# Patient Record
Sex: Female | Born: 1980 | Race: Black or African American | Hispanic: No | Marital: Married | Smoking: Never smoker
Health system: Southern US, Community
[De-identification: ages and names within clinical notes are randomized; demographics above are authoritative.]

## PROBLEM LIST (undated history)

## (undated) ENCOUNTER — Inpatient Hospital Stay (HOSPITAL_COMMUNITY): Payer: Self-pay

## (undated) DIAGNOSIS — Z789 Other specified health status: Secondary | ICD-10-CM

## (undated) HISTORY — PX: NO PAST SURGERIES: SHX2092

## (undated) HISTORY — DX: Other specified health status: Z78.9

---

## 2012-07-27 NOTE — L&D Delivery Note (Signed)
Delivery Note At 1:35 AM a viable female was delivered via Vaginal, Spontaneous Delivery (Presentation: ; Occiput Anterior).  APGAR: 9, 9; weight 7 lb 13.4 oz (3555 g).   Placenta status: Intact, Spontaneous.  Cord: 3 vessels with the following complications: Loose nuchal cord x 1 and body cord  Anesthesia: None  Episiotomy: None Lacerations: 2nd degree;Periurethral Suture Repair: 3.0 vicryl rapide Est. Blood Loss (mL): 350  Mom to postpartum.  Baby to nursery-stable.  Tereso Newcomer, MD 03/07/2013, 4:18 AM

## 2012-10-05 ENCOUNTER — Other Ambulatory Visit (HOSPITAL_COMMUNITY): Payer: Self-pay | Admitting: Nurse Practitioner

## 2012-10-05 LAB — OB RESULTS CONSOLE ANTIBODY SCREEN: Antibody Screen: NEGATIVE

## 2012-10-05 LAB — OB RESULTS CONSOLE HIV ANTIBODY (ROUTINE TESTING)
HIV: NONREACTIVE
HIV: NONREACTIVE

## 2012-10-05 LAB — OB RESULTS CONSOLE GC/CHLAMYDIA
Chlamydia: NEGATIVE
Gonorrhea: NEGATIVE

## 2012-10-05 LAB — OB RESULTS CONSOLE HEPATITIS B SURFACE ANTIGEN: Hepatitis B Surface Ag: NEGATIVE

## 2012-10-05 LAB — OB RESULTS CONSOLE RUBELLA ANTIBODY, IGM: Rubella: IMMUNE

## 2012-10-05 LAB — OB RESULTS CONSOLE RPR: RPR: NONREACTIVE

## 2012-10-07 ENCOUNTER — Ambulatory Visit (HOSPITAL_COMMUNITY)
Admission: RE | Admit: 2012-10-07 | Discharge: 2012-10-07 | Disposition: A | Discharge status: Home / Self Care | Payer: Self-pay | Source: Ambulatory Visit | Attending: Nurse Practitioner | Admitting: Nurse Practitioner

## 2012-10-07 ENCOUNTER — Encounter (HOSPITAL_COMMUNITY): Payer: Self-pay

## 2012-10-07 DIAGNOSIS — O358XX0 Maternal care for other (suspected) fetal abnormality and damage, not applicable or unspecified: Secondary | ICD-10-CM | POA: Insufficient documentation

## 2012-10-07 DIAGNOSIS — Z363 Encounter for antenatal screening for malformations: Secondary | ICD-10-CM | POA: Insufficient documentation

## 2013-01-30 LAB — OB RESULTS CONSOLE GC/CHLAMYDIA
Chlamydia: NEGATIVE
Gonorrhea: NEGATIVE

## 2013-02-28 ENCOUNTER — Other Ambulatory Visit (HOSPITAL_COMMUNITY): Payer: Self-pay | Admitting: Nurse Practitioner

## 2013-02-28 DIAGNOSIS — O480 Post-term pregnancy: Secondary | ICD-10-CM

## 2013-03-01 ENCOUNTER — Telehealth (HOSPITAL_COMMUNITY): Payer: Self-pay | Admitting: *Deleted

## 2013-03-01 ENCOUNTER — Encounter (HOSPITAL_COMMUNITY): Payer: Self-pay | Admitting: *Deleted

## 2013-03-01 NOTE — Telephone Encounter (Signed)
Preadmission screen  

## 2013-03-03 ENCOUNTER — Ambulatory Visit (HOSPITAL_COMMUNITY)
Admission: RE | Admit: 2013-03-03 | Discharge: 2013-03-03 | Disposition: A | Discharge status: Home / Self Care | Payer: Self-pay | Source: Ambulatory Visit | Attending: Nurse Practitioner | Admitting: Nurse Practitioner

## 2013-03-03 ENCOUNTER — Other Ambulatory Visit (HOSPITAL_COMMUNITY): Payer: Self-pay | Admitting: Nurse Practitioner

## 2013-03-03 DIAGNOSIS — O480 Post-term pregnancy: Secondary | ICD-10-CM | POA: Insufficient documentation

## 2013-03-03 DIAGNOSIS — Z3689 Encounter for other specified antenatal screening: Secondary | ICD-10-CM | POA: Insufficient documentation

## 2013-03-05 ENCOUNTER — Inpatient Hospital Stay (HOSPITAL_COMMUNITY)
Admission: AD | Admit: 2013-03-05 | Discharge: 2013-03-05 | Disposition: A | Discharge status: Home / Self Care | Payer: Medicaid Other | Source: Ambulatory Visit | Attending: Obstetrics and Gynecology | Admitting: Obstetrics and Gynecology

## 2013-03-05 ENCOUNTER — Encounter (HOSPITAL_COMMUNITY): Payer: Self-pay | Admitting: *Deleted

## 2013-03-05 DIAGNOSIS — O479 False labor, unspecified: Secondary | ICD-10-CM | POA: Insufficient documentation

## 2013-03-05 NOTE — MAU Note (Signed)
Pt presents with complaints of contractions that started last night and the contractions started getting closer together this morning. Denies any bleeding or LOF.

## 2013-03-07 ENCOUNTER — Inpatient Hospital Stay (HOSPITAL_COMMUNITY)
Admission: AD | Admit: 2013-03-07 | Discharge: 2013-03-09 | DRG: 775 | Disposition: A | Discharge status: Home / Self Care | Payer: Medicaid Other | Source: Ambulatory Visit | Attending: Obstetrics & Gynecology | Admitting: Obstetrics & Gynecology

## 2013-03-07 ENCOUNTER — Encounter (HOSPITAL_COMMUNITY): Payer: Self-pay | Admitting: *Deleted

## 2013-03-07 DIAGNOSIS — Z22330 Carrier of Group B streptococcus: Secondary | ICD-10-CM

## 2013-03-07 DIAGNOSIS — O9989 Other specified diseases and conditions complicating pregnancy, childbirth and the puerperium: Secondary | ICD-10-CM

## 2013-03-07 DIAGNOSIS — O99892 Other specified diseases and conditions complicating childbirth: Secondary | ICD-10-CM | POA: Diagnosis present

## 2013-03-07 LAB — CBC
HCT: 34.2 % — ABNORMAL LOW (ref 36.0–46.0)
Hemoglobin: 11.3 g/dL — ABNORMAL LOW (ref 12.0–15.0)
MCH: 29.6 pg (ref 26.0–34.0)
MCH: 29.7 pg (ref 26.0–34.0)
MCHC: 33 g/dL (ref 30.0–36.0)
MCV: 88.8 fL (ref 78.0–100.0)
MCV: 90 fL (ref 78.0–100.0)
Platelets: 154 10*3/uL (ref 150–400)
RDW: 13.2 % (ref 11.5–15.5)
RDW: 13.3 % (ref 11.5–15.5)
WBC: 5.6 10*3/uL (ref 4.0–10.5)

## 2013-03-07 LAB — RPR: RPR Ser Ql: NONREACTIVE

## 2013-03-07 MED ORDER — FLEET ENEMA 7-19 GM/118ML RE ENEM: 1.0000 | ENEMA | Freq: Every day | RECTAL | Status: DC | PRN

## 2013-03-07 MED ORDER — OXYCODONE-ACETAMINOPHEN 5-325 MG PO TABS
1.0000 | ORAL_TABLET | ORAL | Status: DC | PRN
  Administered 2013-03-07: 1 via ORAL
  Filled 2013-03-07: qty 1

## 2013-03-07 MED ORDER — MEASLES, MUMPS & RUBELLA VAC ~~LOC~~ INJ: 0.5000 mL | INJECTION | Freq: Once | SUBCUTANEOUS | Status: DC

## 2013-03-07 MED ORDER — ACETAMINOPHEN 325 MG PO TABS: 650.0000 mg | ORAL_TABLET | ORAL | Status: DC | PRN

## 2013-03-07 MED ORDER — LACTATED RINGERS IV SOLN
INTRAVENOUS | Status: DC
  Administered 2013-03-07: 01:00:00 via INTRAVENOUS

## 2013-03-07 MED ORDER — LANOLIN HYDROUS EX OINT: TOPICAL_OINTMENT | CUTANEOUS | Status: DC | PRN

## 2013-03-07 MED ORDER — BENZOCAINE-MENTHOL 20-0.5 % EX AERO
1.0000 "application " | INHALATION_SPRAY | CUTANEOUS | Status: DC | PRN
  Administered 2013-03-08: 1 via TOPICAL
  Filled 2013-03-07: qty 56

## 2013-03-07 MED ORDER — SIMETHICONE 80 MG PO CHEW: 80.0000 mg | CHEWABLE_TABLET | ORAL | Status: DC | PRN

## 2013-03-07 MED ORDER — ZOLPIDEM TARTRATE 5 MG PO TABS: 5.0000 mg | ORAL_TABLET | Freq: Every evening | ORAL | Status: DC | PRN

## 2013-03-07 MED ORDER — LIDOCAINE HCL (PF) 1 % IJ SOLN
30.0000 mL | INTRAMUSCULAR | Status: DC | PRN
  Filled 2013-03-07 (×2): qty 30

## 2013-03-07 MED ORDER — SENNOSIDES-DOCUSATE SODIUM 8.6-50 MG PO TABS
2.0000 | ORAL_TABLET | Freq: Every day | ORAL | Status: DC
  Administered 2013-03-07 – 2013-03-08 (×2): 2 via ORAL

## 2013-03-07 MED ORDER — TETANUS-DIPHTH-ACELL PERTUSSIS 5-2.5-18.5 LF-MCG/0.5 IM SUSP: 0.5000 mL | Freq: Once | INTRAMUSCULAR | Status: DC

## 2013-03-07 MED ORDER — WITCH HAZEL-GLYCERIN EX PADS: 1.0000 "application " | MEDICATED_PAD | CUTANEOUS | Status: DC | PRN

## 2013-03-07 MED ORDER — ONDANSETRON HCL 4 MG PO TABS: 4.0000 mg | ORAL_TABLET | ORAL | Status: DC | PRN

## 2013-03-07 MED ORDER — OXYTOCIN 40 UNITS IN LACTATED RINGERS INFUSION - SIMPLE MED
62.5000 mL/h | INTRAVENOUS | Status: DC
  Filled 2013-03-07: qty 1000

## 2013-03-07 MED ORDER — PRENATAL MULTIVITAMIN CH
1.0000 | ORAL_TABLET | Freq: Every day | ORAL | Status: DC
  Administered 2013-03-07 – 2013-03-09 (×3): 1 via ORAL
  Filled 2013-03-07 (×3): qty 1

## 2013-03-07 MED ORDER — DIPHENHYDRAMINE HCL 25 MG PO CAPS: 25.0000 mg | ORAL_CAPSULE | Freq: Four times a day (QID) | ORAL | Status: DC | PRN

## 2013-03-07 MED ORDER — ONDANSETRON HCL 4 MG/2ML IJ SOLN: 4.0000 mg | Freq: Four times a day (QID) | INTRAMUSCULAR | Status: DC | PRN

## 2013-03-07 MED ORDER — CITRIC ACID-SODIUM CITRATE 334-500 MG/5ML PO SOLN: 30.0000 mL | ORAL | Status: DC | PRN

## 2013-03-07 MED ORDER — BUTORPHANOL TARTRATE 1 MG/ML IJ SOLN: 1.0000 mg | INTRAMUSCULAR | Status: DC | PRN

## 2013-03-07 MED ORDER — IBUPROFEN 600 MG PO TABS
600.0000 mg | ORAL_TABLET | Freq: Four times a day (QID) | ORAL | Status: DC | PRN
  Administered 2013-03-07: 600 mg via ORAL
  Filled 2013-03-07: qty 1

## 2013-03-07 MED ORDER — LACTATED RINGERS IV SOLN: 500.0000 mL | INTRAVENOUS | Status: DC | PRN

## 2013-03-07 MED ORDER — ONDANSETRON HCL 4 MG/2ML IJ SOLN: 4.0000 mg | INTRAMUSCULAR | Status: DC | PRN

## 2013-03-07 MED ORDER — OXYTOCIN BOLUS FROM INFUSION
500.0000 mL | INTRAVENOUS | Status: DC
  Administered 2013-03-07: 500 mL via INTRAVENOUS

## 2013-03-07 MED ORDER — DIBUCAINE 1 % RE OINT: 1.0000 "application " | TOPICAL_OINTMENT | RECTAL | Status: DC | PRN

## 2013-03-07 MED ORDER — IBUPROFEN 600 MG PO TABS
600.0000 mg | ORAL_TABLET | Freq: Four times a day (QID) | ORAL | Status: DC
  Administered 2013-03-07 – 2013-03-09 (×9): 600 mg via ORAL
  Filled 2013-03-07 (×9): qty 1

## 2013-03-07 MED ORDER — SODIUM CHLORIDE 0.9 % IV SOLN
2.0000 g | Freq: Once | INTRAVENOUS | Status: AC
  Administered 2013-03-07: 2 g via INTRAVENOUS
  Filled 2013-03-07: qty 2000

## 2013-03-07 NOTE — H&P (Signed)
Stacie Evans is a 32 y.o.G2P1001 female presenting in active labor.  Followed at [facility], was 4 cm dilated during her visit today.  Reports frequent contractions, small amount of bleeding. No LOF. Good FM.  No complications during this pregnancy.  Had term SVD in 2012 of 7-5 infant.    History OB History   Grav Para Term Preterm Abortions TAB SAB Ect Mult Living   2 1 1       1      Past Medical History  Diagnosis Date  . Medical history non-contributory    Past Surgical History  Procedure Laterality Date  . No past surgeries     Family History: family history is negative for Alcohol abuse, and Arthritis, and Asthma, and Birth defects, and Cancer, and COPD, and Depression, and Diabetes, and Drug abuse, and Early death, and Hearing loss, and Heart disease, and Hyperlipidemia, and Hypertension, and Kidney disease, and Learning disabilities, and Mental illness, and Mental retardation, and Miscarriages / Stillbirths, and Stroke, and Vision loss, . Social History:  reports that she has never smoked. She has never used smokeless tobacco. She reports that she does not drink alcohol or use illicit drugs.   Prenatal Transfer Tool  Maternal Diabetes: No Genetic Screening: Normal Maternal Ultrasounds/Referrals: Normal Fetal Ultrasounds or other Referrals:  None Maternal Substance Abuse:  No Significant Maternal Medications:  None Significant Maternal Lab Results:  Lab values include: Group B Strep positive Other Comments:  None  Review of Systems  All other systems reviewed and are negative.      Last menstrual period 05/23/2012. Maternal Exam:  Uterine Assessment: Contraction strength is moderate.  Contraction duration is 40 seconds. Contraction frequency is regular.   Abdomen: Patient reports no abdominal tenderness. Fundal height is 40.   Fetal presentation: vertex  Introitus: Normal vulva. Normal vagina.  Ferning test: not done.  Nitrazine test: not done.  Pelvis: adequate for  delivery.   Cervix: Cervix evaluated by digital exam.   6/90/0/vertex  Fetal Exam Fetal Monitor Review: Baseline rate: 130.  Variability: moderate (6-25 bpm).   Pattern: accelerations present and early decelerations.    Fetal State Assessment: Category I - tracings are normal.     Physical Exam  Constitutional: She is oriented to person, place, and time. She appears well-developed and well-nourished.  HENT:  Head: Normocephalic and atraumatic.  Cardiovascular: Normal rate and regular rhythm.   Respiratory: Effort normal and breath sounds normal.  GI: Soft. Bowel sounds are normal.  Musculoskeletal: Normal range of motion.  Neurological: She is alert and oriented to person, place, and time.     Prenatal labs: ABO, Rh: A/Positive/-- (03/12 0000) Antibody: Negative (03/12 0000) Rubella: Immune (03/12 0000) RPR: Nonreactive (03/12 0000)  HBsAg: Negative (03/12 0000)  HIV: Non-reactive (03/12 0000)  GBS: Positive (07/07 0000)   Assessment/Plan: Admit to L&D Hopeful for SVD Reassuring fetal status  Tereso Newcomer, MD 03/07/2013, 1:00 AM

## 2013-03-07 NOTE — MAU Note (Signed)
To private auto to get pt per wheelchair, pt reports contractions. To room 7, Dr Macon Large in to check pt. SVE 6/90/0, report to [facility]. Pt to room 165 per stretcher

## 2013-03-07 NOTE — Progress Notes (Signed)
UR chart review completed.  

## 2013-03-07 NOTE — MAU Provider Note (Signed)
Please refer to H & P for admission details.  Gryffin Altice, MD, FACOG Attending Obstetrician & Gynecologist Faculty Practice, [facility]   

## 2013-03-08 ENCOUNTER — Encounter (HOSPITAL_COMMUNITY): Payer: Self-pay | Admitting: Physician Assistant

## 2013-03-08 NOTE — Progress Notes (Signed)
Post Partum Day 1 Subjective: no complaints, up ad lib, voiding, tolerating PO and + flatus Ms. Montufar is undecided on contraception.  Her husband is concerned about the side effects of hormonal contraception.  The Mirena and Paragard IUD were both discussed.     Objective: Blood pressure 100/53, pulse 67, temperature 97.4 F (36.3 C), temperature source Oral, resp. rate 18, height 5\' 9"  (1.753 m), weight 88.905 kg (196 lb), last menstrual period 05/23/2012, SpO2 98.00%, unknown if currently breastfeeding.  Physical Exam:  General: alert, cooperative and appears stated age Lochia: appropriate Uterine Fundus: firm DVT Evaluation: No evidence of DVT seen on physical exam. Negative Homan's sign. No cords or calf tenderness.   Recent Labs  03/07/13 0115 03/07/13 0610  HGB 13.2 11.3*  HCT 39.6 34.2*    Assessment/Plan: Plan for discharge tomorrow   LOS: 1 day   CLARK, MICHAEL L 03/08/2013, 9:14 AM   I have seen and examined this patient and agree with above documentation in the PA student's note. She is going to research the copper IUD after we discussed contraceptive options. D/c tomorrow.   Rulon Abide, M.D. [facility] Fellow 03/08/2013 11:43 AM

## 2013-03-08 NOTE — Lactation Note (Signed)
This note was copied from the chart of Stacie Geanie Schrag. Lactation Consultation Note  Patient Name: Stacie Evans ZOXWR'U Date: 03/08/2013 Reason for consult: Follow-up assessment   Maternal Data    Feeding   LATCH Score/Interventions Latch: Grasps breast easily, tongue down, lips flanged, rhythmical sucking.  Audible Swallowing: A few with stimulation  Type of Nipple: Everted at rest and after stimulation  Comfort (Breast/Nipple): Soft / non-tender     Hold (Positioning): Assistance needed to correctly position infant at breast and maintain latch. Intervention(s): Breastfeeding basics reviewed;Support Pillows;Position options  LATCH Score: 8  Lactation Tools Discussed/Used     Consult Status Consult Status: PRN  Experienced BF mom attempting to latch baby when I went into room. Mom reports that nipples are a little tender. Assisted with better positioning of baby and pillows and mom reports that feels much better. Reviewed wide open mouth and keeping the baby close to breast throughout the feeding. Mom reports that breasts are feeling a little fuller today. No questions at present. To call for assist prn.  Pamelia Hoit 03/08/2013, 3:43 PM

## 2013-03-09 ENCOUNTER — Inpatient Hospital Stay (HOSPITAL_COMMUNITY): Admission: RE | Admit: 2013-03-09 | Payer: Self-pay | Source: Ambulatory Visit

## 2013-03-09 MED ORDER — ACETAMINOPHEN-CODEINE 300-30 MG PO TABS: 1.0000 | ORAL_TABLET | ORAL | Status: DC | PRN

## 2013-03-09 MED ORDER — IBUPROFEN 600 MG PO TABS: 600.0000 mg | ORAL_TABLET | Freq: Four times a day (QID) | ORAL | Status: DC

## 2013-03-09 NOTE — Discharge Summary (Deleted)
Obstetric Discharge Summary Reason for Admission: onset of labor Prenatal Procedures: none Intrapartum Procedures: spontaneous vaginal delivery Postpartum Procedures: none Complications-Operative and Postpartum: none Hemoglobin  Date Value Range Status  03/07/2013 11.3* 12.0 - 15.0 g/dL Final     HCT  Date Value Range Status  03/07/2013 34.2* 36.0 - 46.0 % Final    Physical Exam:  General: alert, cooperative and no distress Lochia: appropriate Uterine Fundus: firm Incision: N/A DVT Evaluation: No evidence of DVT seen on physical exam. Negative Homan's sign. No cords or calf tenderness. No significant calf/ankle edema.  Discharge Diagnoses: Term Pregnancy-delivered  Discharge Information: Date: 03/09/2013 Activity: pelvic rest Diet: routine Medications: Ibuprofen, Percocet and Prenatal Multivitamin Condition: stable Instructions: refer to practice specific booklet Discharge to: home Breast feeding and wants to research Copper IUD for contraception.   Newborn Data: Live born female  Birth Weight: 7 lb 13.4 oz (3555 g) APGAR: 9, 9  Home with spouse.Mertie Moores, Tabita Corbo 03/09/2013, 7:24 AM

## 2013-03-09 NOTE — Discharge Summary (Signed)
  Reason for Admission: onset of labor  Prenatal Procedures: none  Intrapartum Procedures: spontaneous vaginal delivery  Postpartum Procedures: none  Complications-Operative and Postpartum: none  Hemoglobin   Date  Value  Range  Status   03/07/2013  11.3*  12.0 - 15.0 g/dL  Final      HCT   Date  Value  Range  Status   03/07/2013  34.2*  36.0 - 46.0 %  Final    Physical Exam:  Filed Vitals:   03/09/13 0605  BP: 94/57  Pulse: 63  Temp: 97.6 F (36.4 C)  Resp: 18    General: alert, cooperative and no distress  Lochia: appropriate  Uterine Fundus: firm  Incision: N/A  DVT Evaluation: No evidence of DVT seen on physical exam.  Negative Homan's sign.  No cords or calf tenderness.  No significant calf/ankle edema.  Discharge Diagnoses: Term Pregnancy-delivered  Discharge Information:  Date: 03/09/2013  Activity: pelvic rest  Diet: routine  Medications: Ibuprofen and Prenatal Multivitamin  Condition: stable  Instructions: refer to practice specific booklet  Discharge to: home  Breast feeding and wants to research Copper IUD for contraception.   Newborn Data:  Live born female  Birth Weight: 7 lb 13.4 oz (3555 g)  APGAR: 9, 9  I examined pt and agree with documentation above and PA-S plan of care. [facility]  Home with spouse.Mertie Moores, [place]  03/09/2013, 7:24 AM

## 2013-07-01 ENCOUNTER — Encounter (HOSPITAL_COMMUNITY): Payer: Self-pay | Admitting: Emergency Medicine

## 2013-07-01 ENCOUNTER — Emergency Department (HOSPITAL_COMMUNITY)
Admission: EM | Admit: 2013-07-01 | Discharge: 2013-07-01 | Disposition: A | Discharge status: Home / Self Care | Payer: Medicaid Other | Source: Home / Self Care | Attending: Emergency Medicine | Admitting: Emergency Medicine

## 2013-07-01 DIAGNOSIS — H5711 Ocular pain, right eye: Secondary | ICD-10-CM

## 2013-07-01 DIAGNOSIS — H119 Unspecified disorder of conjunctiva: Secondary | ICD-10-CM

## 2013-07-01 DIAGNOSIS — H5710 Ocular pain, unspecified eye: Secondary | ICD-10-CM

## 2013-07-01 MED ORDER — KETOROLAC TROMETHAMINE 0.5 % OP SOLN: 1.0000 [drp] | Freq: Four times a day (QID) | OPHTHALMIC | Status: DC

## 2013-07-01 NOTE — ED Notes (Signed)
Pt c/o right eye pain, redness onset 2 months Has tried OTC eye drops w/no relief Denies: f/vn/d, cold sxs She is alert w/no signs of acute distress.

## 2013-07-01 NOTE — ED Provider Notes (Signed)
Medical screening examination/treatment/procedure(s) were performed by non-physician practitioner and as supervising physician I was immediately available for consultation/collaboration.  Leslee Home, M.D.  Reuben Likes, MD 07/01/13 2053

## 2013-07-01 NOTE — ED Provider Notes (Signed)
CSN: [id number]     Arrival date & time 07/01/13  1241 History   First MD Initiated Contact with Patient 07/01/13 1430     Chief Complaint  Patient presents with  . Eye Pain   (Consider location/radiation/quality/duration/timing/severity/associated sxs/prior Treatment) HPI Comments: 32 year old otherwise healthy female presents complaining of right eye pain and redness for 2 months. For 2 months, she has intermittent pain in the eye that is always worse with blinking. She had a similar condition prior to her first pregnancy, but after she delivered, this went away. This current eye pain started during her most recent pregnancy and she did not worry about it the patient seemed it would go away, but this time it did not. She rates her pain as "not that bad." She has never seen a doctor about this problem. She has no blurry vision, or any pain within the eye itself, the pain is just on the surface of the eye in the area that is red. No problems with her other eye.  Patient is a 32 y.o. female presenting with eye pain.  Eye Pain Pertinent negatives include no chest pain, no abdominal pain and no shortness of breath.    Past Medical History  Diagnosis Date  . Medical history non-contributory    Past Surgical History  Procedure Laterality Date  . No past surgeries     Family History  Problem Relation Age of Onset  . Alcohol abuse Neg Hx   . Arthritis Neg Hx   . Asthma Neg Hx   . Birth defects Neg Hx   . Cancer Neg Hx   . COPD Neg Hx   . Depression Neg Hx   . Diabetes Neg Hx   . Drug abuse Neg Hx   . Early death Neg Hx   . Hearing loss Neg Hx   . Heart disease Neg Hx   . Hyperlipidemia Neg Hx   . Hypertension Neg Hx   . Kidney disease Neg Hx   . Learning disabilities Neg Hx   . Mental illness Neg Hx   . Mental retardation Neg Hx   . Miscarriages / Stillbirths Neg Hx   . Stroke Neg Hx   . Vision loss Neg Hx    History  Substance Use Topics  . Smoking status: Never Smoker   .  Smokeless tobacco: Never Used  . Alcohol Use: No   OB History   Grav Para Term Preterm Abortions TAB SAB Ect Mult Living   4 3 2       2      Review of Systems  Constitutional: Negative for fever and chills.  Eyes: Positive for pain and redness. Negative for visual disturbance.  Respiratory: Negative for cough and shortness of breath.   Cardiovascular: Negative for chest pain, palpitations and leg swelling.  Gastrointestinal: Negative for nausea, vomiting and abdominal pain.  Endocrine: Negative for polydipsia and polyuria.  Genitourinary: Negative for dysuria, urgency and frequency.  Musculoskeletal: Negative for arthralgias and myalgias.  Skin: Negative for rash.  Neurological: Negative for dizziness, weakness and light-headedness.    Allergies  Review of patient's allergies indicates no known allergies.  Home Medications   Current Outpatient Rx  Name  Route  Sig  Dispense  Refill  . Prenatal Vit-Fe Fumarate-FA (PRENATAL MULTIVITAMIN) TABS tablet   Oral   Take 1 tablet by mouth daily at 12 noon.         . Acetaminophen-Codeine (TYLENOL/CODEINE #3) 300-30 MG per tablet   Oral  Take 1 tablet by mouth every 4 (four) hours as needed for pain.   30 tablet   0   . ibuprofen (ADVIL,MOTRIN) 600 MG tablet   Oral   Take 1 tablet (600 mg total) by mouth every 6 (six) hours.   30 tablet   0   . ketorolac (ACULAR) 0.5 % ophthalmic solution   Right Eye   Place 1 drop into the right eye every 6 (six) hours.   3 mL   0    BP 125/85  Pulse 68  Temp(Src) 98.7 F (37.1 C) (Oral)  Resp 18 Physical Exam  Nursing note and vitals reviewed. Constitutional: She is oriented to person, place, and time. Vital signs are normal. She appears well-developed and well-nourished. No distress.  HENT:  Head: Normocephalic and atraumatic.  Eyes:    Pulmonary/Chest: Effort normal. No respiratory distress.  Neurological: She is alert and oriented to person, place, and time. She has  normal strength. Coordination normal.  Skin: Skin is warm and dry. No rash noted. She is not diaphoretic.  Psychiatric: She has a normal mood and affect. Judgment normal.    ED Course  Procedures (including critical care time) Labs Review Labs Reviewed - No data to display Imaging Review No results found.    MDM   1. Eye pain, right   2. Conjunctival lesion    This patient has a neovascular eyes/injected lesion on the conjunctiva. She has been referred to ophthalmology for further evaluation of her condition. Ketorolac eyedrops in the meantime to help with the pain.   Meds ordered this encounter  Medications  . ketorolac (ACULAR) 0.5 % ophthalmic solution    Sig: Place 1 drop into the right eye every 6 (six) hours.    Dispense:  3 mL    Refill:  0    Order Specific Question:  Supervising Provider    Answer:  Lorenz Coaster, DAVID C [[id number]]       Graylon Good, PA-C 07/01/13 1556

## 2014-04-04 IMAGING — US US OB DETAIL+14 WK
1 series · 12 of 28 positions shown · non-contrast
Comparison: none

[Series 1: us ob detail +14 wk · 12 of 57 slices shown]
[im 3/57]
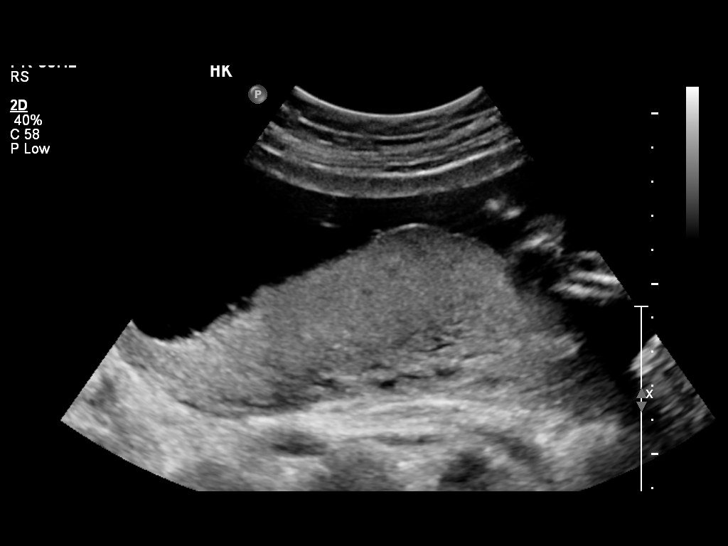
[im 7/57]
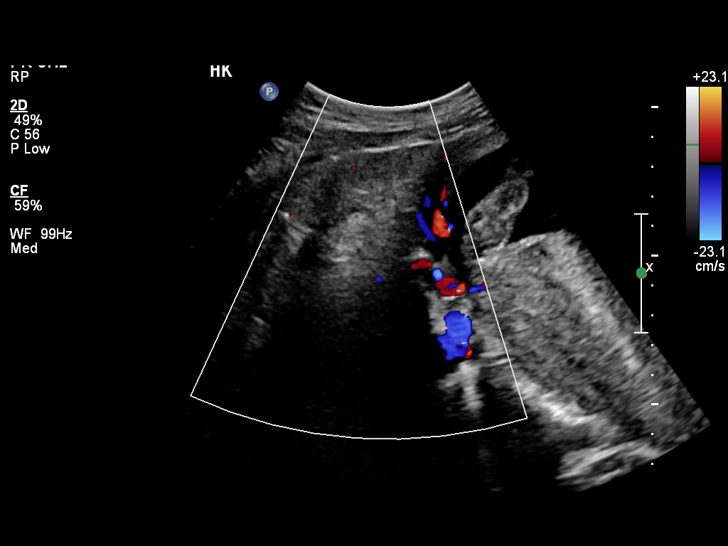
[im 11/57]
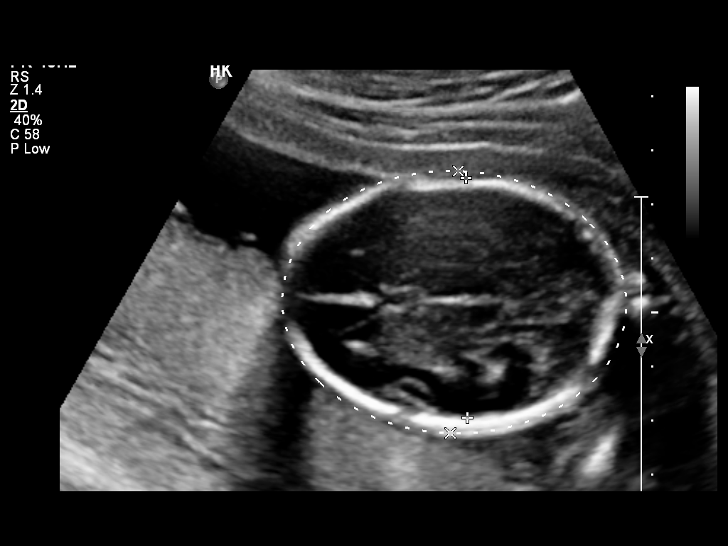
[im 17/57]
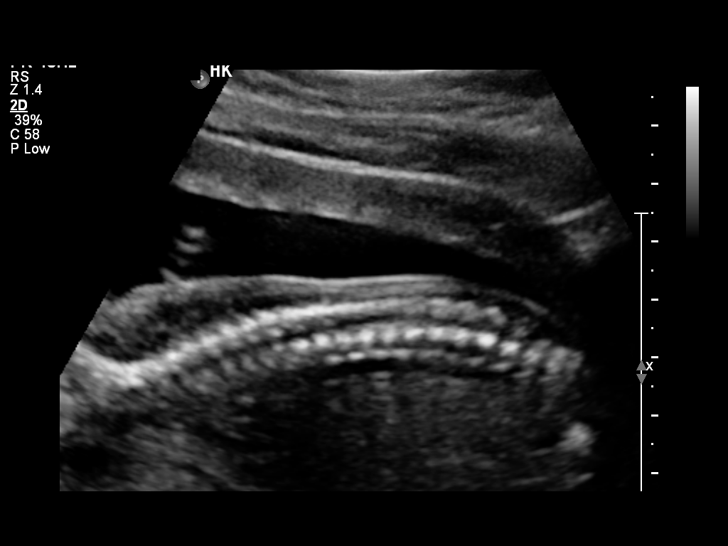
[im 21/57]
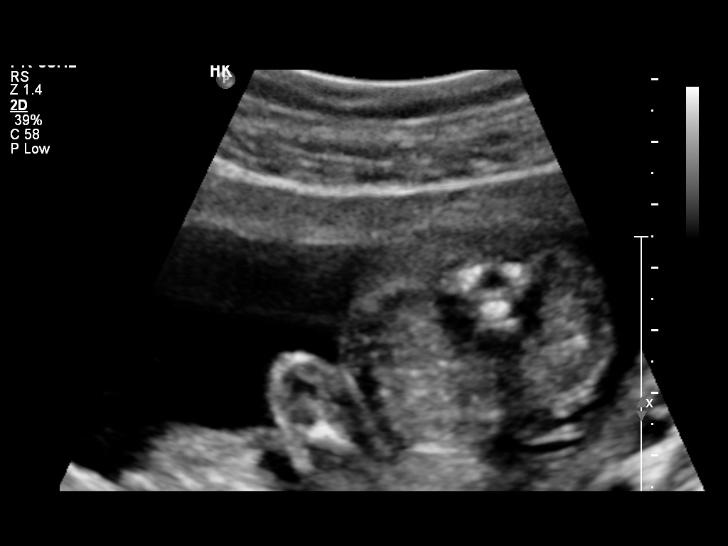
[im 25/57]
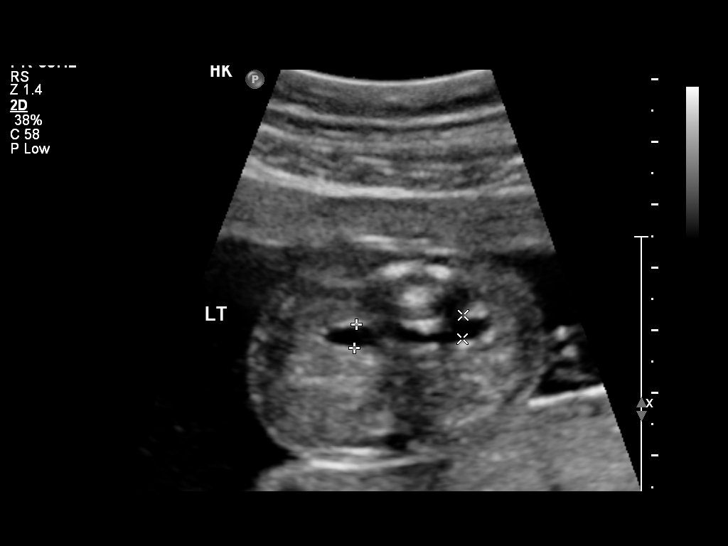
[im 32/57]
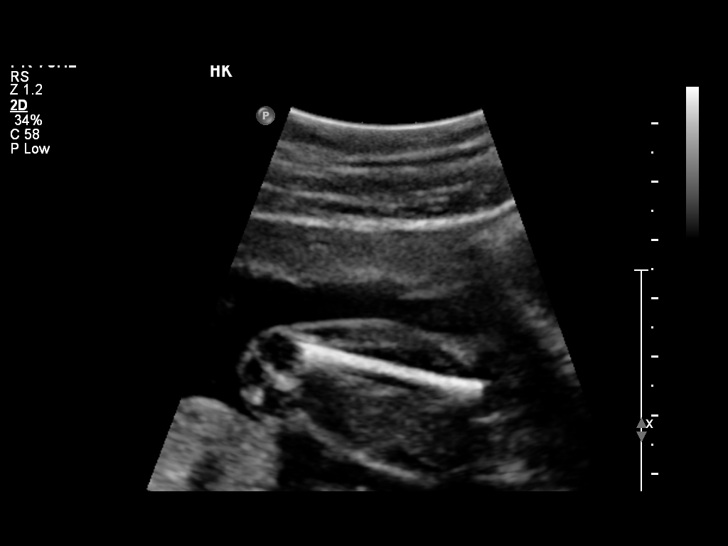
[im 36/57]
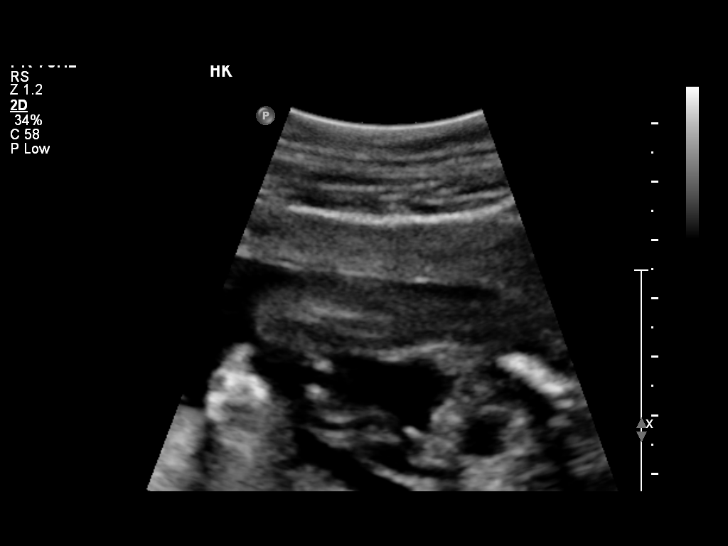
[im 40/57]
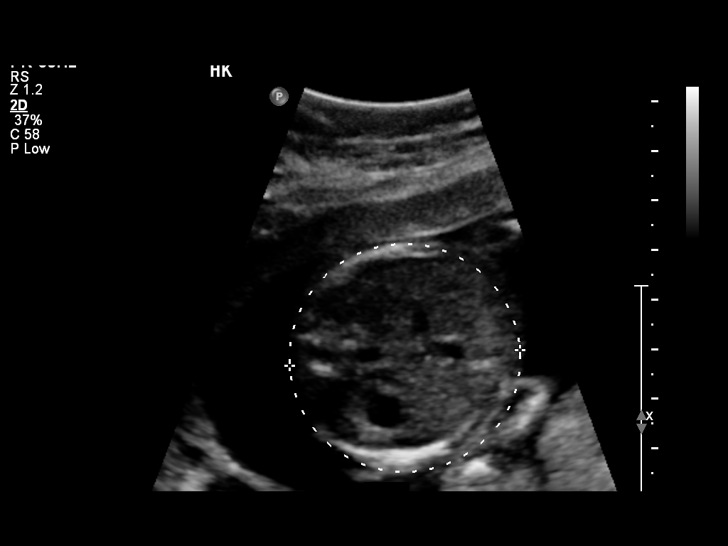
[im 46/57]
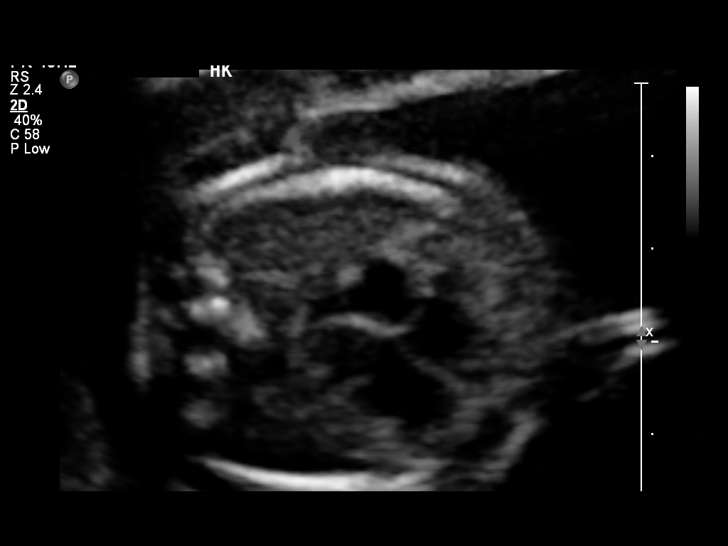
[im 50/57]
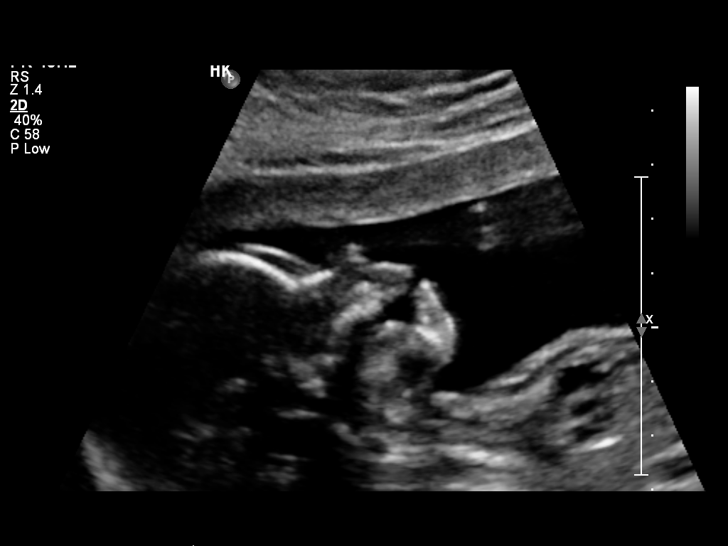
[im 54/57]
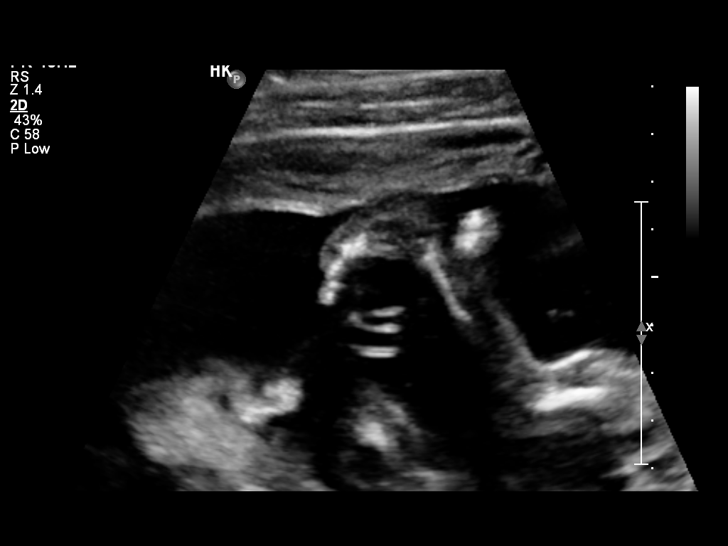

[12 of 28 positions shown; findings below may reference images not displayed]

OBSTETRICS REPORT
                      (Signed Final 10/07/2012 [DATE])

Service(s) Provided

 US OB DETAIL + 14 WK                                  76811.0
Indications

 Detailed fetal anatomic survey
Fetal Evaluation

 Num Of Fetuses:    1
 Preg. Location:    Intrauterine
 Fetal Heart Rate:  143                         bpm
 Cardiac Activity:  Observed
 Presentation:      Breech
 Placenta:          Posterior, above cervical
                    os
 P. Cord            Visualized, central
 Insertion:

 Amniotic Fluid
 AFI FV:      Subjectively within normal limits
                                             Larg Pckt:     5.3  cm
 LLQ:   5.3    cm
Biometry

 BPD:       44  mm    G. Age:   19w 2d                CI:        64.13   70 - 86
                                                      FL/HC:      17.4   16.8 -

 HC:     176.8  mm    G. Age:   20w 1d       70  %    HC/AC:      1.22   1.09 -

 AC:     144.5  mm    G. Age:   19w 5d       52  %    FL/BPD:
 FL:      30.7  mm    G. Age:   19w 4d       41  %    FL/AC:      21.2   20 - 24
 CER:     19.2  mm    G. Age:   18w 4d       25  %
 NFT:     4.09  mm

 Est. FW:     307  gm    0 lb 11 oz      50  %
Gestational Age

 LMP:           19w 4d       Date:   05/23/12                 EDD:   02/27/13
 U/S Today:     19w 5d                                        EDD:   02/26/13
 Best:          19w 4d    Det. By:   LMP  (05/23/12)          EDD:   02/27/13
Anatomy
 Cranium:          Appears normal         Aortic Arch:      Appears normal
 Fetal Cavum:      Appears normal         Ductal Arch:      Appears normal
 Ventricles:       Appears normal         Diaphragm:        Appears normal
 Choroid Plexus:   Appears normal         Stomach:          Appears normal
 Cerebellum:       Appears normal         Abdomen:          Appears normal
 Posterior Fossa:  Appears normal         Abdominal Wall:   Appears nml (cord
                                                            insert, abd wall)
 Nuchal Fold:      Appears normal         Cord Vessels:     Appears normal (3
                                                            vessel cord)
 Face:             Appears normal         Kidneys:          Appear normal
                   (orbits and profile)
 Lips:             Appears normal         Bladder:          Appears normal
 Heart:            Appears normal         Spine:            Appears normal
                   (4CH, axis, and
                   situs)
 RVOT:             Appears normal         Lower             Appears normal
                                          Extremities:
 LVOT:             Appears normal         Upper             Appears normal
                                          Extremities:

 Other:  Heels and 5th digit previously seen. Nasal bone visualized. Fetus
         appears to be a male.
Targeted Anatomy

 Fetal Central Nervous System
 Lat. Ventricles:  7.8                    Cisterna Magna:
Cervix Uterus Adnexa

 Cervical Length:   3.22      cm

 Cervix:       Normal appearance by transabdominal scan.
 Uterus:       No abnormality visualized.
 Cul De Sac:   No free fluid seen.

 Adnexa:     No abnormality visualized.
Impression

 Single live IUP in breech presentation.   Concordant
 measurements/assigned GA by LMP.
 No anatomic abnormality seen with a good quality survey
 possible.

 questions or concerns.

## 2014-05-28 ENCOUNTER — Encounter (HOSPITAL_COMMUNITY): Payer: Self-pay | Admitting: Emergency Medicine

## 2015-07-28 NOTE — L&D Delivery Note (Signed)
Delivery Note At 9:28 AM a viable female was delivered via  (Presentation:vertex ; LOA ).  APGAR:,; weight  pending.   Placenta status: spontaneous,duncan .  Cord:  3VC with the following complications: none.  Cord pH: n/a  Anesthesia:  IV meds Episiotomy:  none Lacerations: periurethral lac- no repair; 2 degree perineal lac  Suture Repair: 3.0 vicryl Est. Blood Loss (mL):  150  Mom to postpartum.  Baby to Couplet care / Skin to Skin.  Stacie Evans Cho 06/14/2016, 9:55 AM

## 2016-01-27 LAB — OB RESULTS CONSOLE GC/CHLAMYDIA
Chlamydia: NEGATIVE
Gonorrhea: NEGATIVE

## 2016-01-27 LAB — OB RESULTS CONSOLE ANTIBODY SCREEN: Antibody Screen: NEGATIVE

## 2016-01-27 LAB — OB RESULTS CONSOLE HIV ANTIBODY (ROUTINE TESTING): HIV: NONREACTIVE

## 2016-01-27 LAB — OB RESULTS CONSOLE ABO/RH: RH Type: POSITIVE

## 2016-01-27 LAB — OB RESULTS CONSOLE RPR: RPR: NONREACTIVE

## 2016-01-27 LAB — OB RESULTS CONSOLE HEPATITIS B SURFACE ANTIGEN: HEP B S AG: NEGATIVE

## 2016-01-27 LAB — OB RESULTS CONSOLE RUBELLA ANTIBODY, IGM: RUBELLA: IMMUNE

## 2016-04-30 LAB — OB RESULTS CONSOLE GC/CHLAMYDIA
CHLAMYDIA, DNA PROBE: NEGATIVE
GC PROBE AMP, GENITAL: NEGATIVE

## 2016-04-30 LAB — OB RESULTS CONSOLE GBS: GBS: POSITIVE

## 2016-06-04 ENCOUNTER — Encounter (HOSPITAL_COMMUNITY): Payer: Self-pay | Admitting: *Deleted

## 2016-06-04 ENCOUNTER — Telehealth (HOSPITAL_COMMUNITY): Payer: Self-pay | Admitting: *Deleted

## 2016-06-04 NOTE — Telephone Encounter (Signed)
Preadmission screen  

## 2016-06-11 ENCOUNTER — Inpatient Hospital Stay (HOSPITAL_COMMUNITY): Admission: RE | Admit: 2016-06-11 | Payer: Medicaid Other | Source: Ambulatory Visit

## 2016-06-13 ENCOUNTER — Inpatient Hospital Stay (HOSPITAL_COMMUNITY)
Admission: AD | Admit: 2016-06-13 | Discharge: 2016-06-14 | Discharge status: Absent without leave | Payer: Medicaid Other | Source: Home / Self Care

## 2016-06-14 ENCOUNTER — Encounter (HOSPITAL_COMMUNITY): Payer: Self-pay

## 2016-06-14 ENCOUNTER — Inpatient Hospital Stay (HOSPITAL_COMMUNITY)
Admission: RE | Admit: 2016-06-14 | Discharge: 2016-06-16 | DRG: 775 | Disposition: A | Discharge status: Home / Self Care | Payer: Medicaid Other | Source: Ambulatory Visit | Attending: Obstetrics & Gynecology | Admitting: Obstetrics & Gynecology

## 2016-06-14 VITALS — BP 110/64 | HR 65 | Temp 98.2°F | Resp 18 | Ht 68.0 in | Wt 211.0 lb

## 2016-06-14 DIAGNOSIS — O480 Post-term pregnancy: Principal | ICD-10-CM | POA: Diagnosis present

## 2016-06-14 DIAGNOSIS — O99824 Streptococcus B carrier state complicating childbirth: Secondary | ICD-10-CM | POA: Diagnosis present

## 2016-06-14 DIAGNOSIS — Z3A41 41 weeks gestation of pregnancy: Secondary | ICD-10-CM

## 2016-06-14 LAB — CBC
HCT: 39.5 % (ref 36.0–46.0)
Hemoglobin: 13.1 g/dL (ref 12.0–15.0)
MCH: 30.4 pg (ref 26.0–34.0)
MCHC: 33.2 g/dL (ref 30.0–36.0)
MCV: 91.6 fL (ref 78.0–100.0)
Platelets: 141 10*3/uL — ABNORMAL LOW (ref 150–400)
RBC: 4.31 MIL/uL (ref 3.87–5.11)
RDW: 13.6 % (ref 11.5–15.5)
WBC: 4.5 10*3/uL (ref 4.0–10.5)

## 2016-06-14 LAB — TYPE AND SCREEN
ABO/RH(D): A POS
Antibody Screen: NEGATIVE

## 2016-06-14 LAB — RPR: RPR Ser Ql: NONREACTIVE

## 2016-06-14 LAB — ABO/RH: ABO/RH(D): A POS

## 2016-06-14 MED ORDER — DIBUCAINE 1 % RE OINT
1.0000 | TOPICAL_OINTMENT | RECTAL | Status: DC | PRN
Start: 2016-06-14 — End: 2016-06-16

## 2016-06-14 MED ORDER — ONDANSETRON HCL 4 MG/2ML IJ SOLN: 4.0000 mg | INTRAMUSCULAR | Status: DC | PRN

## 2016-06-14 MED ORDER — PRENATAL MULTIVITAMIN CH
1.0000 | ORAL_TABLET | Freq: Every day | ORAL | Status: DC
  Administered 2016-06-14 – 2016-06-15 (×2): 1 via ORAL
  Filled 2016-06-14 (×2): qty 1

## 2016-06-14 MED ORDER — BENZOCAINE-MENTHOL 20-0.5 % EX AERO
1.0000 "application " | INHALATION_SPRAY | CUTANEOUS | Status: DC | PRN
  Filled 2016-06-14: qty 56

## 2016-06-14 MED ORDER — DEXTROSE 5 % IV SOLN
5.0000 10*6.[IU] | Freq: Once | INTRAVENOUS | Status: AC
  Administered 2016-06-14: 5 10*6.[IU] via INTRAVENOUS
  Filled 2016-06-14: qty 5

## 2016-06-14 MED ORDER — SENNOSIDES-DOCUSATE SODIUM 8.6-50 MG PO TABS
2.0000 | ORAL_TABLET | ORAL | Status: DC
  Administered 2016-06-14 – 2016-06-15 (×2): 2 via ORAL
  Filled 2016-06-14 (×2): qty 2

## 2016-06-14 MED ORDER — ONDANSETRON HCL 4 MG/2ML IJ SOLN: 4.0000 mg | Freq: Four times a day (QID) | INTRAMUSCULAR | Status: DC | PRN

## 2016-06-14 MED ORDER — FENTANYL CITRATE (PF) 100 MCG/2ML IJ SOLN
100.0000 ug | INTRAMUSCULAR | Status: DC | PRN
  Administered 2016-06-14 (×2): 100 ug via INTRAVENOUS
  Filled 2016-06-14 (×2): qty 2

## 2016-06-14 MED ORDER — ACETAMINOPHEN 325 MG PO TABS: 650.0000 mg | ORAL_TABLET | ORAL | Status: DC | PRN

## 2016-06-14 MED ORDER — ONDANSETRON HCL 4 MG PO TABS: 4.0000 mg | ORAL_TABLET | ORAL | Status: DC | PRN

## 2016-06-14 MED ORDER — MEASLES, MUMPS & RUBELLA VAC ~~LOC~~ INJ: 0.5000 mL | INJECTION | Freq: Once | SUBCUTANEOUS | Status: DC

## 2016-06-14 MED ORDER — OXYCODONE-ACETAMINOPHEN 5-325 MG PO TABS: 2.0000 | ORAL_TABLET | ORAL | Status: DC | PRN

## 2016-06-14 MED ORDER — SODIUM CHLORIDE 0.9% FLUSH: 3.0000 mL | INTRAVENOUS | Status: DC | PRN

## 2016-06-14 MED ORDER — OXYCODONE-ACETAMINOPHEN 5-325 MG PO TABS: 1.0000 | ORAL_TABLET | ORAL | Status: DC | PRN

## 2016-06-14 MED ORDER — TERBUTALINE SULFATE 1 MG/ML IJ SOLN
0.2500 mg | Freq: Once | INTRAMUSCULAR | Status: DC | PRN
  Filled 2016-06-14: qty 1

## 2016-06-14 MED ORDER — TETANUS-DIPHTH-ACELL PERTUSSIS 5-2.5-18.5 LF-MCG/0.5 IM SUSP: 0.5000 mL | Freq: Once | INTRAMUSCULAR | Status: DC

## 2016-06-14 MED ORDER — FLEET ENEMA 7-19 GM/118ML RE ENEM: 1.0000 | ENEMA | RECTAL | Status: DC | PRN

## 2016-06-14 MED ORDER — IBUPROFEN 600 MG PO TABS
600.0000 mg | ORAL_TABLET | Freq: Four times a day (QID) | ORAL | Status: DC
  Administered 2016-06-14 – 2016-06-16 (×8): 600 mg via ORAL
  Filled 2016-06-14 (×8): qty 1

## 2016-06-14 MED ORDER — ZOLPIDEM TARTRATE 5 MG PO TABS: 5.0000 mg | ORAL_TABLET | Freq: Every evening | ORAL | Status: DC | PRN

## 2016-06-14 MED ORDER — WITCH HAZEL-GLYCERIN EX PADS: 1.0000 "application " | MEDICATED_PAD | CUTANEOUS | Status: DC | PRN

## 2016-06-14 MED ORDER — OXYTOCIN BOLUS FROM INFUSION
500.0000 mL | Freq: Once | INTRAVENOUS | Status: AC
  Administered 2016-06-14: 500 mL via INTRAVENOUS

## 2016-06-14 MED ORDER — PENICILLIN G POT IN DEXTROSE 60000 UNIT/ML IV SOLN
3.0000 10*6.[IU] | INTRAVENOUS | Status: DC
  Administered 2016-06-14: 3 10*6.[IU] via INTRAVENOUS
  Filled 2016-06-14 (×3): qty 50

## 2016-06-14 MED ORDER — SODIUM CHLORIDE 0.9 % IV SOLN: 250.0000 mL | INTRAVENOUS | Status: DC | PRN

## 2016-06-14 MED ORDER — LIDOCAINE HCL (PF) 1 % IJ SOLN
30.0000 mL | INTRAMUSCULAR | Status: DC | PRN
  Administered 2016-06-14: 30 mL via SUBCUTANEOUS
  Filled 2016-06-14: qty 30

## 2016-06-14 MED ORDER — OXYTOCIN 40 UNITS IN LACTATED RINGERS INFUSION - SIMPLE MED
2.5000 [IU]/h | INTRAVENOUS | Status: DC
  Filled 2016-06-14: qty 1000

## 2016-06-14 MED ORDER — SODIUM CHLORIDE 0.9% FLUSH: 3.0000 mL | Freq: Two times a day (BID) | INTRAVENOUS | Status: DC

## 2016-06-14 MED ORDER — DIPHENHYDRAMINE HCL 25 MG PO CAPS: 25.0000 mg | ORAL_CAPSULE | Freq: Four times a day (QID) | ORAL | Status: DC | PRN

## 2016-06-14 MED ORDER — SIMETHICONE 80 MG PO CHEW: 80.0000 mg | CHEWABLE_TABLET | ORAL | Status: DC | PRN

## 2016-06-14 MED ORDER — LACTATED RINGERS IV SOLN
INTRAVENOUS | Status: DC
  Administered 2016-06-14: 01:00:00 via INTRAVENOUS

## 2016-06-14 MED ORDER — SOD CITRATE-CITRIC ACID 500-334 MG/5ML PO SOLN: 30.0000 mL | ORAL | Status: DC | PRN

## 2016-06-14 MED ORDER — OXYTOCIN 40 UNITS IN LACTATED RINGERS INFUSION - SIMPLE MED
1.0000 m[IU]/min | INTRAVENOUS | Status: DC
  Administered 2016-06-14: 2 m[IU]/min via INTRAVENOUS
  Administered 2016-06-14: 16 m[IU]/min via INTRAVENOUS

## 2016-06-14 MED ORDER — LACTATED RINGERS IV SOLN: 500.0000 mL | INTRAVENOUS | Status: DC | PRN

## 2016-06-14 MED ORDER — COCONUT OIL OIL
1.0000 "application " | TOPICAL_OIL | Status: DC | PRN
  Filled 2016-06-14: qty 120

## 2016-06-14 NOTE — Anesthesia Pain Management Evaluation Note (Signed)
  CRNA Pain Management Visit Note  Patient: Stacie Evans, 35 y.o., female  "Hello [facility]. We have an anesthesia team available at all times to provide care throughout the hospital, including epidural management and anesthesia for C-section. I don't know your plan for the delivery whether it a natural birth, water birth, IV sedation, nitrous supplementation, doula or epidural, but we want to meet your pain goals."   1.Was your pain managed to your expectations on prior hospitalizations?   Yes   2.What is your expectation for pain management during this hospitalization?     IV pain meds  3.How can we help you reach that goal? Support prn  Record the patient's initial score and the patient's pain goal.   Pain: 10  Pain Goal: 7 [facility] wants you to be able to say your pain was always managed very well.  [facility],Roshana Shuffield 06/14/2016

## 2016-06-14 NOTE — Lactation Note (Signed)
This note was copied from a baby's chart. Lactation Consultation Note  Patient Name: Stacie Evans ZOXWR'UToday's Date: 06/14/2016 Reason for consult: Initial assessment Baby at 10 hr of life. Experienced bf mom reporting bilateral nipple soreness. No skin break down was noted but mom has very dry flay skin on the entire breat. Given coconut oil. Soreness is most likely positioning. Baby was laying on his back on pillows in front of mom. She was leaning over him, dangling her nipple in his mouth, and letting him latch to the tip of the nipple. Showed mom how to unwrap baby, hold his body (not hold the pillow under him), turn him to her, when he opens wide pull in close to her, and compress the breast to get a deep latch. Discussed baby behavior, feeding frequency, baby belly size, voids, wt loss, breast changes, and nipple care. Demonstrated manual expression, colostrum noted bilaterally. Given lactation handouts. Aware of OP services and support group.    Maternal Data Has patient been taught Hand Expression?: Yes Does the patient have breastfeeding experience prior to this delivery?: Yes  Feeding Feeding Type: Breast Fed Length of feed: 5 min  LATCH Score/Interventions Latch: Grasps breast easily, tongue down, lips flanged, rhythmical sucking. Intervention(s): Adjust position;Breast compression  Audible Swallowing: None Intervention(s): Hand expression;Skin to skin  Type of Nipple: Everted at rest and after stimulation  Comfort (Breast/Nipple): Filling, red/small blisters or bruises, mild/mod discomfort  Problem noted: Mild/Moderate discomfort Interventions (Mild/moderate discomfort): Hand expression (coconut oil)  Hold (Positioning): Assistance needed to correctly position infant at breast and maintain latch. Intervention(s): Support Pillows;Position options  LATCH Score: 6  Lactation Tools Discussed/Used     Consult Status Consult Status: Follow-up Date:  06/15/16 Follow-up type: In-patient    [name] 06/14/2016, 8:09 PM

## 2016-06-14 NOTE — H&P (Signed)
LABOR ADMISSION HISTORY AND PHYSICAL  Stacie Evans is a 35 y.o. female 965P2002 with IUP at 1590w3d by 21w U/S presenting for PD IOL. She reports +FM, + contractions, No LOF, no VB, no blurry vision, headaches or peripheral edema, and RUQ pain.  She plans on breast and bottle feeding. She plans on using condoms for birth control.  Dating: By Larene Beach21w U/S --->  Estimated Date of Delivery: 06/04/16  Sono:    @ 3145w4d, 6 day discrepancy from LMP, normal anatomy, cephalic presentation, transverse lie, 477.99 g, 28.7% EFW   Prenatal History/Complications: GBS pos FOB may have hepatitis per HD records MOB with negative HbSag  Past Medical History: Past Medical History:  Diagnosis Date  . Medical history non-contributory     Past Surgical History: Past Surgical History:  Procedure Laterality Date  . NO PAST SURGERIES      Obstetrical History: OB History    Gravida Para Term Preterm AB Living   5 3 2     2    SAB TAB Ectopic Multiple Live Births           2      Social History: Social History   Social History  . Marital status: Married    Spouse name: N/A  . Number of children: N/A  . Years of education: N/A   Social History Main Topics  . Smoking status: Never Smoker  . Smokeless tobacco: Never Used  . Alcohol use No  . Drug use: No  . Sexual activity: Yes   Other Topics Concern  . None   Social History Narrative  . None    Family History: Family History  Problem Relation Age of Onset  . Alcohol abuse Neg Hx   . Arthritis Neg Hx   . Asthma Neg Hx   . Birth defects Neg Hx   . Cancer Neg Hx   . COPD Neg Hx   . Depression Neg Hx   . Diabetes Neg Hx   . Drug abuse Neg Hx   . Early death Neg Hx   . Hearing loss Neg Hx   . Heart disease Neg Hx   . Hyperlipidemia Neg Hx   . Hypertension Neg Hx   . Kidney disease Neg Hx   . Learning disabilities Neg Hx   . Mental illness Neg Hx   . Mental retardation Neg Hx   . Miscarriages / Stillbirths Neg Hx   . Stroke Neg  Hx   . Vision loss Neg Hx     Allergies: No Known Allergies  Prescriptions Prior to Admission  Medication Sig Dispense Refill Last Dose  . Acetaminophen-Codeine (TYLENOL/CODEINE #3) 300-30 MG per tablet Take 1 tablet by mouth every 4 (four) hours as needed for pain. 30 tablet 0 06/14/2016  . ibuprofen (ADVIL,MOTRIN) 600 MG tablet Take 1 tablet (600 mg total) by mouth every 6 (six) hours. 30 tablet 0 06/14/2016  . Prenatal Vit-Fe Fumarate-FA (PRENATAL MULTIVITAMIN) TABS tablet Take 1 tablet by mouth daily at 12 noon.   06/14/2016     Review of Systems   All systems reviewed and negative except as stated in HPI  Temp 97.6 F (36.4 C) (Oral)   Resp 16   Ht 5\' 8"  (1.727 m)   Wt 95.7 kg (211 lb)   BMI 32.08 kg/m  General appearance: alert Lungs: clear to auscultation bilaterally Heart: regular rate and rhythm Abdomen: soft, non-tender; bowel sounds normal Extremities: Homans sign is negative, no sign of DVT, edema Presentation: cephalic  Fetal monitoringBaseline: 140 bpm, Variability: Good {> 6 bpm), Accelerations: Reactive and Decelerations: Absent Uterine activityFrequency: Irregular     Prenatal labs: ABO, Rh: A/Positive/-- (07/03 0000) Antibody: Negative (07/03 0000) Rubella: Immune  RPR: Nonreactive (07/03 0000)  HBsAg: Negative (07/03 0000)  HIV: Non-reactive (07/03 0000)  GBS: Positive (10/05 0000)  1 hr Glucola: 99 Genetic screening: Negative CF screen, too late for others Anatomy US: normal  Prenatal Transfer Tool  Maternal Diabetes: No Genetic Screening: Normal Maternal Ultrasounds/Referrals: Normal Fetal Ultrasounds or other Referrals:  None Maternal Substance Abuse:  No Significant Maternal Medications:  None Significant Maternal Lab Results: Lab values include: Group B Strep positive  No results found for this or any previous visit (from the past 24 hour(s)).  Patient Active Problem List   Diagnosis Date Noted  . Post-dates pregnancy 06/14/2016     Assessment: Stacie Chessnekole Elliff is a 35 y.o. G5P2002 at 4283w3d here for IOL for post-dates.   #Labor: Will start pitocin.  #Pain: Does not want pain medication - natural #FWB: Cat I #ID:  GBS +, penicillin ordered #MOF: Breast and bottle #MOC: Condoms #Circ: Yes - perhaps inpatient depending on cost (Medicaid)  Dani GobbleHillary Fitzgerald, MD [facility], PGY-2  OB FELLOW HISTORY AND PHYSICAL ATTESTATION  I have seen and examined this patient; I agree with above documentation in the resident's note.    Stacie Evans 06/14/2016, 2:29 AM

## 2016-06-15 NOTE — Progress Notes (Signed)
Post Partum Day 1 Subjective: no complaints, up ad lib, voiding and tolerating PO  Objective: Blood pressure (!) 108/55, pulse 63, temperature 98.3 F (36.8 C), temperature source Oral, resp. rate 20, height 5\' 8"  (1.727 m), weight 211 lb (95.7 kg), SpO2 100 %.  Physical Exam:  General: alert, cooperative, appears stated age and no distress Lochia: appropriate Uterine Fundus: firm Incision: n/a DVT Evaluation: No evidence of DVT seen on physical exam.   Recent Labs  06/14/16 0127  HGB 13.1  HCT 39.5    Assessment/Plan: Plan for discharge tomorrow   LOS: 1 day   Stacie Evans 06/15/2016, 7:26 AM

## 2016-06-15 NOTE — Lactation Note (Signed)
This note was copied from a baby's chart. Lactation Consultation Note Experienced BF mom c/o painful BF. Mom cradle BF when LC entered rm. Baby wrapped in 2 blankets up over head. Removed blankets placed baby to mom STS. Mom holding in cradle position. Baby pulling on nipple, not having a deep latch, cheeks not touching breast.  Unswaddled baby, educated on positioning, STS, I&O, props, comfort during BF. Referred to Baby and Me Book in Breastfeeding section Pg. 22-23 for position options and Proper latch demonstration. Hand expressed easy flow of colostrum. Mom stated she didn't think baby was getting full. Educated on newborn feeding behaviors. Encouraged to feel breast before BF and afterwards to see transfer, keeping baby close baby will get more and cause less pain.  After adjusted baby, mom stated felt much better. Heard swallows frequently after repositioned.  Patient Name: Stacie Evans'UToday's Date: 06/15/2016 Reason for consult: Follow-up assessment;Breast/nipple pain   Maternal Data    Feeding Feeding Type: Breast Fed Length of feed: 40 min  LATCH Score/Interventions Latch: Repeated attempts needed to sustain latch, nipple held in mouth throughout feeding, stimulation needed to elicit sucking reflex. Intervention(s): Adjust position;Assist with latch;Breast massage;Breast compression  Audible Swallowing: Spontaneous and intermittent Intervention(s): Skin to skin;Hand expression  Type of Nipple: Everted at rest and after stimulation  Comfort (Breast/Nipple): Filling, red/small blisters or bruises, mild/mod discomfort  Problem noted: Mild/Moderate discomfort Interventions (Mild/moderate discomfort): Hand massage  Hold (Positioning): Assistance needed to correctly position infant at breast and maintain latch. Intervention(s): Breastfeeding basics reviewed;Support Pillows;Position options;Skin to skin  LATCH Score: 7  Lactation Tools Discussed/Used     Consult  Status Consult Status: Follow-up Date: 06/15/16 (in pm) Follow-up type: In-patient    Deja Kaigler, Diamond NickelLAURA G 06/15/2016, 6:30 AM

## 2016-06-15 NOTE — Lactation Note (Signed)
This note was copied from a baby's chart. Lactation Consultation Note  P3, Ex BF,  29 hours old. Visitors in room.  Mother denies problems or questions. Suggest mother call if she needs further assistance.   Patient Name: Stacie Evans XLKGM'WToday's Date: 06/15/2016     Maternal Data    Feeding Feeding Type: Breast Fed Length of feed: 30 min  LATCH Score/Interventions                      Lactation Tools Discussed/Used     Consult Status      Stacie Evans, Stacie Evans 06/15/2016, 3:21 PM

## 2016-06-16 MED ORDER — ZOLPIDEM TARTRATE 5 MG PO TABS: 5.0000 mg | ORAL_TABLET | Freq: Every evening | ORAL | 0 refills | Status: AC | PRN

## 2016-06-16 MED ORDER — ACETAMINOPHEN 325 MG PO TABS: 650.0000 mg | ORAL_TABLET | ORAL | 0 refills | Status: AC | PRN

## 2016-06-16 MED ORDER — WITCH HAZEL-GLYCERIN EX PADS: 1.0000 "application " | MEDICATED_PAD | CUTANEOUS | 12 refills | Status: AC | PRN

## 2016-06-16 MED ORDER — COCONUT OIL OIL: 1.0000 "application " | TOPICAL_OIL | 0 refills | Status: AC | PRN

## 2016-06-16 MED ORDER — BENZOCAINE-MENTHOL 20-0.5 % EX AERO: 1.0000 "application " | INHALATION_SPRAY | CUTANEOUS | 1 refills | Status: AC | PRN

## 2016-06-16 MED ORDER — DIBUCAINE 1 % RE OINT: 1.0000 "application " | TOPICAL_OINTMENT | RECTAL | 0 refills | Status: AC | PRN

## 2016-06-16 MED ORDER — SENNOSIDES-DOCUSATE SODIUM 8.6-50 MG PO TABS: 2.0000 | ORAL_TABLET | ORAL | 1 refills | Status: AC

## 2016-06-16 MED ORDER — SIMETHICONE 80 MG PO CHEW: 80.0000 mg | CHEWABLE_TABLET | ORAL | 0 refills | Status: AC | PRN

## 2016-06-16 MED ORDER — IBUPROFEN 600 MG PO TABS: 600.0000 mg | ORAL_TABLET | Freq: Four times a day (QID) | ORAL | 0 refills | Status: AC

## 2016-06-16 MED ORDER — DIPHENHYDRAMINE HCL 25 MG PO CAPS: 25.0000 mg | ORAL_CAPSULE | Freq: Four times a day (QID) | ORAL | 0 refills | Status: AC | PRN

## 2016-06-16 NOTE — Discharge Summary (Signed)
OB Discharge Summary     Patient Name: Stacie Evans DOB: 1980-08-31 MRN: [id number]  Date of admission: 06/14/2016 Delivering MD: Zerita BoersLAWSON, DARLENE   Date of discharge: 06/16/2016  Admitting diagnosis: Induction Intrauterine pregnancy: 5633w5d     Secondary diagnosis:  Active Problems:   Post-dates pregnancy  Additional problems: none     Discharge diagnosis: Term Pregnancy Delivered                                                                                                Post partum procedures:None  Augmentation: Pitocin  Complications: None  Hospital course:  Induction of Labor With Vaginal Delivery   35 y.o. yo G3P1002 at 7733w5d was admitted to the hospital 06/14/2016 for induction of labor.  Indication for induction: Postdates.  Patient had an uncomplicated labor course as follows: Membrane Rupture Time/Date: 8:54 AM ,06/14/2016   Intrapartum Procedures: Episiotomy: None [1]                                         Lacerations:  2nd degree [3];Perineal [11]  Patient had delivery of a Viable infant.  Information for the patient's newborn:  Stacie Evans, [name]  Delivery Method: Vaginal, Spontaneous Delivery (Filed from Delivery Summary)   06/14/2016  Details of delivery can be found in separate delivery note.  Patient had a routine postpartum course. Patient is discharged home 06/16/16.   Physical exam Vitals:   06/14/16 1635 06/15/16 0604 06/15/16 1900 06/16/16 0535  BP: (!) 106/55 (!) 108/55 116/62 110/64  Pulse: 70 63 64 65  Resp: 16 20 18 18   Temp: 98.7 F (37.1 C) 98.3 F (36.8 C) 98.2 F (36.8 C) 98.2 F (36.8 C)  TempSrc: Oral Oral Oral   SpO2:  100%    Weight:      Height:       General: alert Lochia: appropriate Uterine Fundus: firm Incision: N/A DVT Evaluation: No evidence of DVT seen on physical exam. Labs: Lab Results  Component Value Date   WBC 4.5 06/14/2016   HGB 13.1 06/14/2016   HCT 39.5 06/14/2016   MCV 91.6  06/14/2016   PLT 141 (L) 06/14/2016   No flowsheet data found.  Discharge instruction: per After Visit Summary and "Baby and Me Booklet".  After visit meds:    Medication List    STOP taking these medications   prenatal multivitamin Tabs tablet     TAKE these medications   acetaminophen 325 MG tablet Commonly known as:  TYLENOL Take 2 tablets (650 mg total) by mouth every 4 (four) hours as needed (for pain scale < 4).   benzocaine-Menthol 20-0.5 % Aero Commonly known as:  DERMOPLAST Apply 1 application topically as needed for irritation (perineal discomfort).   coconut oil Oil Apply 1 application topically as needed.   dibucaine 1 % Oint Commonly known as:  NUPERCAINAL Place 1 application rectally as needed for hemorrhoids.   diphenhydrAMINE 25 mg capsule Commonly known as:  BENADRYL Take 1 capsule (25  mg total) by mouth every 6 (six) hours as needed for itching.   ibuprofen 600 MG tablet Commonly known as:  ADVIL,MOTRIN Take 1 tablet (600 mg total) by mouth every 6 (six) hours.   senna-docusate 8.6-50 MG tablet Commonly known as:  Senokot-S Take 2 tablets by mouth daily. Start taking on:  06/17/2016   simethicone 80 MG chewable tablet Commonly known as:  MYLICON Chew 1 tablet (80 mg total) by mouth as needed for flatulence.   witch hazel-glycerin pad Commonly known as:  TUCKS Apply 1 application topically as needed for hemorrhoids.   zolpidem 5 MG tablet Commonly known as:  AMBIEN Take 1 tablet (5 mg total) by mouth at bedtime as needed for sleep.       Diet: home with mother  Activity: Advance as tolerated. Pelvic rest for 6 weeks.   Outpatient follow up:6 weeks Follow up Appt:No future appointments. Follow up Visit:No Follow-up on file.  Postpartum contraception: Condoms  Newborn Data: Live born female  Birth Weight: 9 lb 2.9 oz (4165 g) APGAR: 9, 9  Baby Feeding: Breast with supplementation Disposition:home with  mother   06/16/2016 Stacie Evans   CNM attestation I have seen and examined this patient and agree with above documentation in the resident's note.   Stacie Evans is a 35 y.o. G3P1002 s/p SVD.   Pain is well controlled.  Plan for birth control is condoms.  Method of Feeding: breast/bottle  PE:  BP 110/64 (BP Location: Right Arm)   Pulse 65   Temp 98.2 F (36.8 C)   Resp 18   Ht 5\' 8"  (1.727 m)   Wt 95.7 kg (211 lb)   SpO2 100%   BMI 32.08 kg/m  Fundus firm  No results for input(s): HGB, HCT in the last 72 hours.   Plan: discharge today - postpartum care discussed - f/u clinic in 6 weeks for postpartum visit   SHAW, KIMBERLY, CNM 12:12 AM

## 2016-06-16 NOTE — Discharge Instructions (Signed)

## 2016-06-16 NOTE — Lactation Note (Signed)
This note was copied from a baby's chart. Lactation Consultation Note  Patient Name: Stacie Evans RUEAV'WToday's Date: 06/16/2016 Reason for consult: Follow-up assessment Mom is experienced BF and reports this baby is nursing well. Encouraged to continue to BF with feeding ques, 8-12 times or more in 24 hours. Encouraged to BF both breasts most feedings 15-20 minutes. Engorgement care reviewed if needed. Advised of OP services and support group. Mom requested Harmony hand pump for home, flange 27 given for comfort if needed.   Maternal Data    Feeding Feeding Type: Breast Fed Length of feed: 15 min  LATCH Score/Interventions                      Lactation Tools Discussed/Used Tools: Pump Breast pump type: Manual   Consult Status Consult Status: Complete Date: 06/16/16 Follow-up type: In-patient    Alfred LevinsGranger, Ashiyah Pavlak Ann 06/16/2016, 10:10 AM

## 2017-04-09 ENCOUNTER — Encounter (HOSPITAL_COMMUNITY): Payer: Self-pay
